# Patient Record
Sex: Male | Born: 1990 | Race: Black or African American | Hispanic: No | Marital: Single | Smoking: Never smoker
Health system: Southern US, Community
[De-identification: ages and names within clinical notes are randomized; demographics above are authoritative.]

## PROBLEM LIST (undated history)

## (undated) DIAGNOSIS — L309 Dermatitis, unspecified: Secondary | ICD-10-CM

## (undated) DIAGNOSIS — J45909 Unspecified asthma, uncomplicated: Secondary | ICD-10-CM

---
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
## 2012-05-12 ENCOUNTER — Encounter (HOSPITAL_COMMUNITY): Payer: Self-pay | Admitting: Emergency Medicine

## 2012-05-12 ENCOUNTER — Emergency Department (HOSPITAL_COMMUNITY)
Admission: EM | Admit: 2012-05-12 | Discharge: 2012-05-12 | Disposition: A | Discharge status: Home / Self Care | Payer: Self-pay | Attending: Emergency Medicine | Admitting: Emergency Medicine

## 2012-05-12 DIAGNOSIS — L309 Dermatitis, unspecified: Secondary | ICD-10-CM

## 2012-05-12 DIAGNOSIS — L259 Unspecified contact dermatitis, unspecified cause: Secondary | ICD-10-CM | POA: Insufficient documentation

## 2012-05-12 HISTORY — DX: Dermatitis, unspecified: L30.9

## 2012-05-12 MED ORDER — LORATADINE 10 MG PO TABS: 10.0000 mg | ORAL_TABLET | Freq: Every day | ORAL | Status: AC

## 2012-05-12 MED ORDER — DIPHENHYDRAMINE HCL 25 MG PO CAPS: 25.0000 mg | ORAL_CAPSULE | Freq: Four times a day (QID) | ORAL | Status: AC | PRN

## 2012-05-12 MED ORDER — DIPHENHYDRAMINE HCL 50 MG/ML IJ SOLN
50.0000 mg | Freq: Once | INTRAMUSCULAR | Status: AC
  Administered 2012-05-12: 50 mg via INTRAMUSCULAR
  Filled 2012-05-12: qty 1

## 2012-05-12 MED ORDER — TRIAMCINOLONE ACETONIDE 0.1 % EX CREA: TOPICAL_CREAM | Freq: Two times a day (BID) | CUTANEOUS | Status: AC

## 2012-05-12 NOTE — ED Notes (Addendum)
PT reports hx of eczema; waking up with stiffness and lots of dry skin. He is extremely itchy and in pain in the places he is having the exacerbations.

## 2012-05-12 NOTE — ED Provider Notes (Signed)
History     CSN: [id number]  Arrival date & time 05/12/12  2147   First MD Initiated Contact with Patient 05/12/12 2238      Chief Complaint  Patient presents with  . Rash   HPI  History provided by patient and family. Patient is a 21 year old African American male with history of eczema and seasonal allergies who presents with complaints of worsening eczema rash over the skin. Patient states he has had some increasing eczema rash over the past few days but woke up this morning with even more severe rash and itching. He reports dry skin covering most of the body. He reports this makes his joints feel stiff. Rash is associated with severe pruritus. Patient does admit to scratching area. He has not taken any medications or used any treatments for his rash. Patient does report using hydrocortisone creams in the past with some improvement. Patient does not have a primary care provider or dermatology specialist for his symptoms. Symptoms have been associated with slight increased rhinorrhea as well. Patient used to take Claritin for seasonal allergy symptoms but is not on any antihistamines currently. He denies any fever, chills or sweats. There is no bleeding or drainage from any scratching or lesions.   Past Medical History  Diagnosis Date  . Eczema     No past surgical history on file.  No family history on file.  History  Substance Use Topics  . Smoking status: Not on file  . Smokeless tobacco: Not on file  . Alcohol Use:       Review of Systems  Constitutional: Negative for fever and chills.  Skin: Positive for rash.  Neurological: Negative for headaches.    Allergies  Peanut-containing drug products  Home Medications  No current outpatient prescriptions on file.  BP 141/82  Pulse 77  Temp 97.8 F (36.6 C) (Oral)  Resp 22  SpO2 100%  Physical Exam  Nursing note and vitals reviewed. Constitutional: He is oriented to person, place, and time. He appears  well-developed and well-nourished. No distress.  HENT:  Head: Normocephalic.  Mouth/Throat: Oropharynx is clear and moist.  Neck: Normal range of motion. Neck supple.       No meningeal signs  Cardiovascular: Normal rate and regular rhythm.   No murmur heard. Pulmonary/Chest: Effort normal and breath sounds normal. No respiratory distress. He has no wheezes. He has no rales.  Abdominal: Soft.  Neurological: He is alert and oriented to person, place, and time.  Skin: Skin is warm. Rash noted. No erythema.       There is diffuse dry rough rash heaviest over extensor surfaces and around the base of the neck posteriorly. Multiple secondary excoriations present without deep ulceration or bleeding. No induration of the skin. Symptoms consistent with eczema  Psychiatric: He has a normal mood and affect. His behavior is normal.    ED Course  Procedures     1. Eczema       MDM  10:50PM patient seen and evaluated. Patient with dry thickened rash covering most of the body heaviest over extensor surfaces. There is dry flaking skin and secondary excoriations. Symptoms consistent with history of eczema        Angus Seller, [place] 05/13/12 [phone]

## 2012-05-13 NOTE — ED Provider Notes (Signed)
Medical screening examination/treatment/procedure(s) were performed by non-physician practitioner and as supervising physician I was immediately available for consultation/collaboration.   Lylliana Kitamura, MD 05/13/12 2215 

## 2017-04-22 ENCOUNTER — Encounter (HOSPITAL_COMMUNITY): Payer: Self-pay | Admitting: Emergency Medicine

## 2017-04-22 ENCOUNTER — Emergency Department (HOSPITAL_COMMUNITY)
Admission: EM | Admit: 2017-04-22 | Discharge: 2017-04-22 | Disposition: A | Discharge status: Home / Self Care | Payer: Self-pay | Attending: Emergency Medicine | Admitting: Emergency Medicine

## 2017-04-22 DIAGNOSIS — W458XXA Other foreign body or object entering through skin, initial encounter: Secondary | ICD-10-CM | POA: Insufficient documentation

## 2017-04-22 DIAGNOSIS — Y939 Activity, unspecified: Secondary | ICD-10-CM | POA: Insufficient documentation

## 2017-04-22 DIAGNOSIS — T162XXA Foreign body in left ear, initial encounter: Secondary | ICD-10-CM | POA: Insufficient documentation

## 2017-04-22 DIAGNOSIS — Y998 Other external cause status: Secondary | ICD-10-CM | POA: Insufficient documentation

## 2017-04-22 DIAGNOSIS — Y929 Unspecified place or not applicable: Secondary | ICD-10-CM | POA: Insufficient documentation

## 2017-04-22 MED ORDER — LIDOCAINE-EPINEPHRINE-TETRACAINE (LET) SOLUTION
3.0000 mL | Freq: Once | NASAL | Status: AC
  Administered 2017-04-22: 3 mL via TOPICAL
  Filled 2017-04-22: qty 3

## 2017-04-22 NOTE — ED Triage Notes (Signed)
Pt noticed earring post sticking out from behind ear lobe but earring on front is gone. Tried to remove himself but it hurt so came here.

## 2017-04-22 NOTE — ED Notes (Addendum)
During downtime. PA Sanders given lidocaine for pt's ear. Given verbal d/c instructions. Pt ambulatory at discharge.

## 2017-04-22 NOTE — ED Provider Notes (Signed)
[facility]-EMERGENCY DEPT Provider Note   CSN: [id number] Arrival date & time: 04/22/17  1216  By signing my name below, I, Rosana Fretana Waskiewicz, attest that this documentation has been prepared under the direction and in the presence of non-physician practitioner, Garlon HatchetSanders, Lynzy Rawles M., PA-C. Electronically Signed: Rosana Fretana Waskiewicz, ED Scribe. 04/22/17. 2:01 PM.  History   Chief Complaint Chief Complaint  Patient presents with  . Foreign Body in Ear   The history is provided by the patient. No language interpreter was used.   HPI Comments: Phillip BolognaDale Chevere is a 26 y.o. male who presents to the Emergency Department to be evaluated for a foreign body attached to his left ear lobe 2 days ago. Pt states he got his ear pierced 1 month ago and noticed the earring to be stuck in the skin. Pt has tried to remove the piece himself with no success. No other complaints at this time.  Past Medical History:  Diagnosis Date  . Eczema     There are no active problems to display for this patient.   History reviewed. No pertinent surgical history.     Home Medications    Prior to Admission medications   Medication Sig Start Date End Date Taking? Authorizing Provider  diphenhydrAMINE (BENADRYL) 25 mg capsule Take 1 capsule (25 mg total) by mouth every 6 (six) hours as needed for itching. 05/12/12 05/22/12  Ivonne Andrewammen, Peter, PA-C  loratadine (CLARITIN) 10 MG tablet Take 1 tablet (10 mg total) by mouth daily. 05/12/12 05/12/13  Ivonne Andrewammen, Peter, PA-C    Family History History reviewed. No pertinent family history.  Social History Social History  Substance Use Topics  . Smoking status: Not on file  . Smokeless tobacco: Not on file  . Alcohol use Not on file     Allergies   Peanut-containing drug products   Review of Systems Review of Systems  Constitutional: Negative for fever.  HENT:       +foreigh body in left ear lobe  Skin: Negative for pallor.     Physical Exam Updated Vital Signs BP (!) 136/99  (BP Location: Left Arm)   Pulse 70   Temp 98.9 F (37.2 C) (Oral)   Resp 16   SpO2 100%   Physical Exam  Constitutional: He is oriented to person, place, and time. He appears well-developed and well-nourished.  HENT:  Head: Normocephalic and atraumatic.  Mouth/Throat: Oropharynx is clear and moist.  Stud type earring embedded into piercing site; back is protruding out, ball of the stud is palpable beneath skin  Eyes: Pupils are equal, round, and reactive to light. Conjunctivae and EOM are normal.  Neck: Normal range of motion.  Cardiovascular: Normal rate, regular rhythm and normal heart sounds.   Pulmonary/Chest: Effort normal and breath sounds normal.  Abdominal: Soft. Bowel sounds are normal.  Musculoskeletal: Normal range of motion.  Neurological: He is alert and oriented to person, place, and time.  Skin: Skin is warm and dry.  Psychiatric: He has a normal mood and affect.  Nursing note and vitals reviewed.    ED Treatments / Results  DIAGNOSTIC STUDIES: Oxygen Saturation is 100% on RA, normal by my interpretation.   COORDINATION OF CARE: 2:21 PM-Discussed next steps with pt including removal of foreign body. Pt verbalized understanding and is agreeable with the plan.   Labs (all labs ordered are listed, but only abnormal results are displayed) Labs Reviewed - No data to display  EKG  EKG Interpretation None       Radiology  No results found.  Procedures .Foreign Body Removal Date/Time: 04/25/2017 5:14 PM Performed by: Garlon Hatchet Authorized by: Garlon Hatchet  Consent: Verbal consent obtained. Risks and benefits: risks, benefits and alternatives were discussed Consent given by: patient Patient understanding: patient states understanding of the procedure being performed Patient identity confirmed: verbally with patient Body area: ear Location details: left ear Anesthesia: local infiltration  Anesthesia: Local Anesthetic: lidocaine 1% without  epinephrine Anesthetic total: 1 mL  Sedation: Patient sedated: no Patient restrained: no Localization method: visualized 1 objects recovered. Objects recovered: earring Post-procedure assessment: foreign body removed Patient tolerance: Patient tolerated the procedure well with no immediate complications   (including critical care time)  Medications Ordered in ED Medications - No data to display   Initial Impression / Assessment and Plan / ED Course  I have reviewed the triage vital signs and the nursing notes.  Pertinent labs & imaging results that were available during my care of the patient were reviewed by me and considered in my medical decision making (see chart for details).  Ear was numbed with lidocaine w/out epi and earring was successfully removed from posterior piercing site and then replaced correctly without issue.  No bleeding or other complications.  Discussed continued care of area due to new piercing.  Can follow-up as needed.  Patient d/c home in stable condition.  Final Clinical Impressions(s) / ED Diagnoses   Final diagnoses:  Foreign body of left ear, initial encounter    New Prescriptions New Prescriptions   No medications on file   I personally performed the services described in this documentation, which was scribed in my presence. The recorded information has been reviewed and is accurate.    Garlon Hatchet, PA-C 04/25/17 1716    Rolland Porter, MD 05/01/17 [phone]

## 2020-09-19 ENCOUNTER — Other Ambulatory Visit: Payer: Self-pay

## 2020-09-19 DIAGNOSIS — Z20822 Contact with and (suspected) exposure to covid-19: Secondary | ICD-10-CM

## 2020-09-20 LAB — SARS-COV-2, NAA 2 DAY TAT

## 2020-09-20 LAB — NOVEL CORONAVIRUS, NAA: SARS-CoV-2, NAA: DETECTED — AB

## 2021-01-03 ENCOUNTER — Emergency Department (HOSPITAL_COMMUNITY)
Admission: EM | Admit: 2021-01-03 | Discharge: 2021-01-03 | Disposition: A | Discharge status: Home / Self Care | Payer: BC Managed Care – PPO | Attending: Emergency Medicine | Admitting: Emergency Medicine

## 2021-01-03 ENCOUNTER — Other Ambulatory Visit: Payer: Self-pay

## 2021-01-03 DIAGNOSIS — R059 Cough, unspecified: Secondary | ICD-10-CM | POA: Diagnosis present

## 2021-01-03 DIAGNOSIS — Z91010 Allergy to peanuts: Secondary | ICD-10-CM | POA: Insufficient documentation

## 2021-01-03 DIAGNOSIS — J4541 Moderate persistent asthma with (acute) exacerbation: Secondary | ICD-10-CM | POA: Insufficient documentation

## 2021-01-03 MED ORDER — AEROCHAMBER PLUS FLO-VU LARGE MISC: 1.0000 | Freq: Once | Status: DC

## 2021-01-03 MED ORDER — ALBUTEROL SULFATE HFA 108 (90 BASE) MCG/ACT IN AERS
6.0000 | INHALATION_SPRAY | Freq: Once | RESPIRATORY_TRACT | Status: AC
  Administered 2021-01-03: 6 via RESPIRATORY_TRACT
  Filled 2021-01-03: qty 6.7

## 2021-01-03 MED ORDER — PREDNISONE 10 MG PO TABS: 50.0000 mg | ORAL_TABLET | Freq: Every day | ORAL | 0 refills | Status: AC

## 2021-01-03 MED ORDER — ALBUTEROL SULFATE HFA 108 (90 BASE) MCG/ACT IN AERS: 1.0000 | INHALATION_SPRAY | Freq: Four times a day (QID) | RESPIRATORY_TRACT | 2 refills | Status: DC | PRN

## 2021-01-03 NOTE — ED Provider Notes (Signed)
[facility] EMERGENCY DEPARTMENT Provider Note   CSN: [id number] Arrival date & time: 01/03/21  1214     History No chief complaint on file.   Phillip Cohen is a 30 y.o. male.  30 year old male with history of asthma presents with complaint of cough, wheezing, chest tightness. Patient reports cough x 2 weeks, taking Zyrtec daily with improvement however worse since last night. Does not have an inhaler. Denies any fevers, chills, sick contacts. No prior hospitalizations for asthma.         Past Medical History:  Diagnosis Date  . Eczema     There are no problems to display for this patient.   No past surgical history on file.     No family history on file.     Home Medications Prior to Admission medications   Medication Sig Start Date End Date Taking? Authorizing Provider  albuterol (VENTOLIN HFA) 108 (90 Base) MCG/ACT inhaler Inhale 1-2 puffs into the lungs every 6 (six) hours as needed for wheezing or shortness of breath. 01/03/21  Yes Jeannie Fend, PA-C  predniSONE (DELTASONE) 10 MG tablet Take 5 tablets (50 mg total) by mouth daily for 5 days. 01/03/21 01/08/21 Yes Jeannie Fend, PA-C  diphenhydrAMINE (BENADRYL) 25 mg capsule Take 1 capsule (25 mg total) by mouth every 6 (six) hours as needed for itching. 05/12/12 05/22/12  Ivonne Andrew, PA-C  loratadine (CLARITIN) 10 MG tablet Take 1 tablet (10 mg total) by mouth daily. 05/12/12 05/12/13  Ivonne Andrew, PA-C    Allergies    Peanut-containing drug products  Review of Systems   Review of Systems  Constitutional: Negative for fever.  HENT: Negative for congestion.   Respiratory: Positive for cough, chest tightness, shortness of breath and wheezing.   Cardiovascular: Negative for chest pain, palpitations and leg swelling.  Gastrointestinal: Negative for nausea and vomiting.  Musculoskeletal: Negative for arthralgias and myalgias.  Skin: Negative for rash and wound.  Allergic/Immunologic:  Negative for immunocompromised state.  Neurological: Negative for weakness.  Hematological: Negative for adenopathy.  Psychiatric/Behavioral: Negative for confusion.  All other systems reviewed and are negative.   Physical Exam Updated Vital Signs BP (!) 146/90 (BP Location: Right Arm)   Pulse 80   Temp 98.6 F (37 C) (Oral)   Resp 16   SpO2 94%   Physical Exam Vitals and nursing note reviewed.  Constitutional:      General: He is not in acute distress.    Appearance: He is well-developed. He is not diaphoretic.  HENT:     Head: Normocephalic and atraumatic.     Nose: Nose normal.     Mouth/Throat:     Mouth: Mucous membranes are moist.  Eyes:     Conjunctiva/sclera: Conjunctivae normal.  Cardiovascular:     Rate and Rhythm: Normal rate and regular rhythm.     Heart sounds: Normal heart sounds.  Pulmonary:     Effort: Pulmonary effort is normal.     Breath sounds: Wheezing present.  Musculoskeletal:     Right lower leg: No edema.     Left lower leg: No edema.  Skin:    General: Skin is warm and dry.     Findings: No erythema or rash.  Neurological:     Mental Status: He is alert and oriented to person, place, and time.  Psychiatric:        Behavior: Behavior normal.     ED Results / Procedures / Treatments   Labs (  all labs ordered are listed, but only abnormal results are displayed) Labs Reviewed - No data to display  EKG None  Radiology No results found.  Procedures Procedures   Medications Ordered in ED Medications  AeroChamber Plus Flo-Vu Large MISC 1 each (has no administration in time range)  albuterol (VENTOLIN HFA) 108 (90 Base) MCG/ACT inhaler 6 puff (6 puffs Inhalation Given 01/03/21 1235)    ED Course  I have reviewed the triage vital signs and the nursing notes.  Pertinent labs & imaging results that were available during my care of the patient were reviewed by me and considered in my medical decision making (see chart for  details).  Clinical Course as of 01/03/21 1402  Thu Jan 03, 2021  [id number] 30 year old male with asthma as above, on exam found to have diffuse wheezing, not in distress, vitals reassuring. On recheck, wheezing has resolved, patient is feeling improved. Offered for patient to continue to wait or can dc from triage with prednisone and inhaler from triage. Plan is to return for worsening or concerning symptoms, follow up with PCP, given referral. Patient feels better and would like to dc at this time.  [LM]    Clinical Course User Index [LM] Alden Hipp   MDM Rules/Calculators/A&P                          Final Clinical Impression(s) / ED Diagnoses Final diagnoses:  Moderate persistent asthma with exacerbation    Rx / DC Orders ED Discharge Orders         Ordered    predniSONE (DELTASONE) 10 MG tablet  Daily        01/03/21 1342    albuterol (VENTOLIN HFA) 108 (90 Base) MCG/ACT inhaler  Every 6 hours PRN        01/03/21 1342           Jeannie Fend, PA-C 01/03/21 1402    Margarita Grizzle, MD 01/07/21 1407

## 2021-01-03 NOTE — ED Triage Notes (Signed)
Emergency Medicine Provider Triage Evaluation Note  Phillip Cohen , a 30 y.o. male  was evaluated in triage.  Pt complains of for several weeks since yesterday feeling tight, wheezing, shortness of breath.  Does not use inhaler.  Review of Systems  Positive: Shortness of breath, wheezing, cough Negative: Fever sick contacts  Physical Exam  There were no vitals taken for this visit. Gen:   Awake, no distress   HEENT:  Atraumatic  Resp:  Normal effort, wheezing throughout Cardiac:  Normal rate  Abd:   Nondistended, nontender  MSK:   Moves extremities without difficulty  Neuro:  Speech clear   Medical Decision Making  Medically screening exam initiated at 12:28 PM.  Appropriate orders placed.  Phillip Cohen was informed that the remainder of the evaluation will be completed by another provider, this initial triage assessment does not replace that evaluation, and the importance of remaining in the ED until their evaluation is complete.  Clinical Impression  Albuterol inhaler and spacer ordered    Jeannie Fend, PA-C 01/03/21 1229

## 2021-01-03 NOTE — ED Triage Notes (Signed)
Pt presents to the ED with chest tightness and wheezing for 2 weeks but it has gotten worse in the last day. Hx of asthma.

## 2021-01-03 NOTE — Discharge Instructions (Signed)
Take your Zyrtec daily. Use inhaler as needed as prescribed for cough or shortness of breath or wheezing. Add Flonase as needed. Return to ER for severe concerning symptoms otherwise follow-up with primary care provider.  Referral given, primary care if needed.  Take prednisone as prescribed and complete the full course.

## 2021-03-26 ENCOUNTER — Other Ambulatory Visit: Payer: Self-pay

## 2021-03-26 ENCOUNTER — Ambulatory Visit
Admission: EM | Admit: 2021-03-26 | Discharge: 2021-03-26 | Disposition: A | Discharge status: Home / Self Care | Payer: Self-pay | Attending: Student | Admitting: Student

## 2021-03-26 DIAGNOSIS — J4521 Mild intermittent asthma with (acute) exacerbation: Secondary | ICD-10-CM

## 2021-03-26 DIAGNOSIS — Z760 Encounter for issue of repeat prescription: Secondary | ICD-10-CM

## 2021-03-26 DIAGNOSIS — J301 Allergic rhinitis due to pollen: Secondary | ICD-10-CM

## 2021-03-26 HISTORY — DX: Unspecified asthma, uncomplicated: J45.909

## 2021-03-26 MED ORDER — PREDNISONE 20 MG PO TABS: 40.0000 mg | ORAL_TABLET | Freq: Every day | ORAL | 0 refills | Status: AC

## 2021-03-26 MED ORDER — ALBUTEROL SULFATE HFA 108 (90 BASE) MCG/ACT IN AERS: 1.0000 | INHALATION_SPRAY | Freq: Four times a day (QID) | RESPIRATORY_TRACT | 2 refills | Status: AC | PRN

## 2021-03-26 NOTE — ED Provider Notes (Signed)
[facility]    CSN: [id number] Arrival date & time: 03/26/21  1305      History   Chief Complaint Chief Complaint  Patient presents with   Asthma    HPI Phillip Cohen is a 30 y.o. male presenting with asthma exacerbation x1 day following running out of albuterol inhaler.  Medical history asthma, eczema, allergies.  States he has been out of his inhaler for few months, which has been okay until the current exacerbation.  He does also note allergic rhinitis that is currently untreated but he occasionally will take Zyrtec.  Endorses wheezing, shortness of breath, nasal congestion.  Denies recent URI, fever/chills, dizziness, chest pain, weakness.  HPI  Past Medical History:  Diagnosis Date   Asthma    Eczema     There are no problems to display for this patient.   History reviewed. No pertinent surgical history.     Home Medications    Prior to Admission medications   Medication Sig Start Date End Date Taking? Authorizing Provider  predniSONE (DELTASONE) 20 MG tablet Take 2 tablets (40 mg total) by mouth daily for 5 days. 03/26/21 03/31/21 Yes Rhys Martini, PA-C  albuterol (VENTOLIN HFA) 108 (90 Base) MCG/ACT inhaler Inhale 1-2 puffs into the lungs every 6 (six) hours as needed for wheezing or shortness of breath. 03/26/21   Rhys Martini, PA-C  diphenhydrAMINE (BENADRYL) 25 mg capsule Take 1 capsule (25 mg total) by mouth every 6 (six) hours as needed for itching. 05/12/12 05/22/12  Ivonne Andrew, PA-C  loratadine (CLARITIN) 10 MG tablet Take 1 tablet (10 mg total) by mouth daily. 05/12/12 05/12/13  Ivonne Andrew, PA-C    Family History History reviewed. No pertinent family history.  Social History Social History   Tobacco Use   Smoking status: Never   Smokeless tobacco: Never  Substance Use Topics   Alcohol use: Not Currently   Drug use: Not Currently     Allergies   Peanut-containing drug products   Review of Systems Review of Systems   Constitutional:  Negative for appetite change, chills and fever.  HENT:  Positive for congestion. Negative for ear pain, rhinorrhea, sinus pressure, sinus pain and sore throat.   Eyes:  Negative for redness and visual disturbance.  Respiratory:  Positive for shortness of breath and wheezing. Negative for cough and chest tightness.   Cardiovascular:  Negative for chest pain and palpitations.  Gastrointestinal:  Negative for abdominal pain, constipation, diarrhea, nausea and vomiting.  Genitourinary:  Negative for dysuria, frequency and urgency.  Musculoskeletal:  Negative for myalgias.  Neurological:  Negative for dizziness, weakness and headaches.  Psychiatric/Behavioral:  Negative for confusion.   All other systems reviewed and are negative.   Physical Exam Triage Vital Signs ED Triage Vitals  Enc Vitals Group     BP 03/26/21 1453 122/85     Pulse Rate 03/26/21 1453 69     Resp 03/26/21 1453 18     Temp 03/26/21 1453 98.5 F (36.9 C)     Temp Source 03/26/21 1453 Oral     SpO2 03/26/21 1453 98 %     Weight 03/26/21 1454 230 lb (104.3 kg)     Height 03/26/21 1454 5\' 5"  (1.651 m)     Head Circumference --      Peak Flow --      Pain Score 03/26/21 1453 0     Pain Loc --      Pain Edu? --  Excl. in GC? --    No data found.  Updated Vital Signs BP 122/85 (BP Location: Left Arm)   Pulse 69   Temp 98.5 F (36.9 C) (Oral)   Resp 18   Ht 5\' 5"  (1.651 m)   Wt 230 lb (104.3 kg)   SpO2 98%   BMI 38.27 kg/m   Visual Acuity Right Eye Distance:   Left Eye Distance:   Bilateral Distance:    Right Eye Near:   Left Eye Near:    Bilateral Near:     Physical Exam Vitals reviewed.  Constitutional:      General: He is not in acute distress.    Appearance: Normal appearance. He is not ill-appearing.     Interventions: He is not intubated. HENT:     Head: Normocephalic and atraumatic.     Right Ear: Hearing, tympanic membrane, ear canal and external ear normal. No  swelling or tenderness. There is no impacted cerumen. No mastoid tenderness. Tympanic membrane is not perforated, erythematous, retracted or bulging.     Left Ear: Hearing, tympanic membrane, ear canal and external ear normal. No swelling or tenderness. There is no impacted cerumen. No mastoid tenderness. Tympanic membrane is not perforated, erythematous, retracted or bulging.     Nose:     Right Sinus: No maxillary sinus tenderness or frontal sinus tenderness.     Left Sinus: No maxillary sinus tenderness or frontal sinus tenderness.     Mouth/Throat:     Mouth: Mucous membranes are moist.     Pharynx: Uvula midline. No oropharyngeal exudate or posterior oropharyngeal erythema.     Tonsils: No tonsillar exudate.  Cardiovascular:     Rate and Rhythm: Normal rate and regular rhythm.     Heart sounds: Normal heart sounds.  Pulmonary:     Effort: Pulmonary effort is normal. No tachypnea, bradypnea, accessory muscle usage, prolonged expiration, respiratory distress or retractions. He is not intubated.     Breath sounds: Normal air entry. Wheezing present. No decreased breath sounds, rhonchi or rales.     Comments: Inspiratory and expiratory wheezes throughout. Chest:     Chest wall: No tenderness.  Abdominal:     General: Abdomen is flat. Bowel sounds are normal.     Tenderness: There is no abdominal tenderness. There is no guarding or rebound.  Lymphadenopathy:     Cervical: No cervical adenopathy.  Neurological:     General: No focal deficit present.     Mental Status: He is alert and oriented to person, place, and time.  Psychiatric:        Attention and Perception: Attention and perception normal.        Mood and Affect: Mood and affect normal.        Behavior: Behavior normal. Behavior is cooperative.        Thought Content: Thought content normal.        Judgment: Judgment normal.     UC Treatments / Results  Labs (all labs ordered are listed, but only abnormal results are  displayed) Labs Reviewed - No data to display  EKG   Radiology No results found.  Procedures Procedures (including critical care time)  Medications Ordered in UC Medications - No data to display  Initial Impression / Assessment and Plan / UC Course  I have reviewed the triage vital signs and the nursing notes.  Pertinent labs & imaging results that were available during my care of the patient were reviewed by me and considered  in my medical decision making (see chart for details).     This patient is a very pleasant 30 y.o. year old male presenting with asthma exacerbation. Afebrile, nontachy. Wheezes throughout.   Albuterol refilled as below. Prednisone, he is not a diabetic.  Zyrtec for allergic rhinitis component.   ED return precautions discussed. Patient verbalizes understanding and agreement.   Coding this visit a level 4 for acute illness with acute exacerbation and prescription drug management.  Final Clinical Impressions(s) / UC Diagnoses   Final diagnoses:  Mild intermittent asthma with acute exacerbation  Medication refill  Seasonal allergic rhinitis due to pollen     Discharge Instructions      -Albuterol inhaler up to every 6 hours during asthma flare -Zyrtec daily x7 days, continue for longer if it's helping -Prednisone, 2 pills taken at the same time for 5 days in a row.  Try taking this earlier in the day as it can give you energy. Avoid NSAIDs like ibuprofen and alleve while taking this medication as they can increase your risk of stomach upset and even GI bleeding when in combination with a steroid. You can continue tylenol (acetaminophen) up to 1000mg  3x daily.      ED Prescriptions     Medication Sig Dispense Auth. Provider   albuterol (VENTOLIN HFA) 108 (90 Base) MCG/ACT inhaler Inhale 1-2 puffs into the lungs every 6 (six) hours as needed for wheezing or shortness of breath. 18 g , PA-C   predniSONE (DELTASONE) 20 MG tablet  Take 2 tablets (40 mg total) by mouth daily for 5 days. 10 tablet Rhys Martini, PA-C      PDMP not reviewed this encounter.   Rhys Martini, PA-C 03/26/21 1536

## 2021-03-26 NOTE — Discharge Instructions (Addendum)
-  Albuterol inhaler up to every 6 hours during asthma flare -Zyrtec daily x7 days, continue for longer if it's helping -Prednisone, 2 pills taken at the same time for 5 days in a row.  Try taking this earlier in the day as it can give you energy. Avoid NSAIDs like ibuprofen and alleve while taking this medication as they can increase your risk of stomach upset and even GI bleeding when in combination with a steroid. You can continue tylenol (acetaminophen) up to 1000mg  3x daily.

## 2021-03-26 NOTE — ED Triage Notes (Signed)
Pt c/o asthma exacerbation since yesterday with wheezing and chest tightness. No distress noted. States he is out of his inhaler.

## 2021-05-19 ENCOUNTER — Other Ambulatory Visit: Payer: Self-pay

## 2021-05-19 ENCOUNTER — Encounter (HOSPITAL_COMMUNITY): Payer: Self-pay | Admitting: Emergency Medicine

## 2021-05-19 ENCOUNTER — Emergency Department (HOSPITAL_COMMUNITY)
Admission: EM | Admit: 2021-05-19 | Discharge: 2021-05-19 | Disposition: A | Discharge status: Home / Self Care | Payer: BC Managed Care – PPO | Attending: Emergency Medicine | Admitting: Emergency Medicine

## 2021-05-19 DIAGNOSIS — W260XXA Contact with knife, initial encounter: Secondary | ICD-10-CM | POA: Insufficient documentation

## 2021-05-19 DIAGNOSIS — S61210A Laceration without foreign body of right index finger without damage to nail, initial encounter: Secondary | ICD-10-CM | POA: Insufficient documentation

## 2021-05-19 DIAGNOSIS — Z5321 Procedure and treatment not carried out due to patient leaving prior to being seen by health care provider: Secondary | ICD-10-CM | POA: Insufficient documentation

## 2021-05-19 NOTE — ED Notes (Signed)
Pt stated he was leaving. 

## 2021-05-19 NOTE — ED Triage Notes (Signed)
Pt reports he cut his right pointer finger on a knife.

## 2021-07-01 ENCOUNTER — Other Ambulatory Visit: Payer: Self-pay

## 2021-07-01 ENCOUNTER — Emergency Department (HOSPITAL_COMMUNITY)
Admission: EM | Admit: 2021-07-01 | Discharge: 2021-07-01 | Disposition: A | Discharge status: Home / Self Care | Payer: BC Managed Care – PPO | Attending: Emergency Medicine | Admitting: Emergency Medicine

## 2021-07-01 ENCOUNTER — Emergency Department (HOSPITAL_COMMUNITY): Payer: BC Managed Care – PPO

## 2021-07-01 DIAGNOSIS — J45901 Unspecified asthma with (acute) exacerbation: Secondary | ICD-10-CM | POA: Insufficient documentation

## 2021-07-01 DIAGNOSIS — Z8616 Personal history of COVID-19: Secondary | ICD-10-CM | POA: Diagnosis not present

## 2021-07-01 DIAGNOSIS — Z5321 Procedure and treatment not carried out due to patient leaving prior to being seen by health care provider: Secondary | ICD-10-CM | POA: Insufficient documentation

## 2021-07-01 DIAGNOSIS — R0602 Shortness of breath: Secondary | ICD-10-CM | POA: Diagnosis present

## 2021-07-01 LAB — CBC WITH DIFFERENTIAL/PLATELET
Abs Immature Granulocytes: 0.08 10*3/uL — ABNORMAL HIGH (ref 0.00–0.07)
Basophils Absolute: 0.1 10*3/uL (ref 0.0–0.1)
Basophils Relative: 1 %
Eosinophils Absolute: 0.6 10*3/uL — ABNORMAL HIGH (ref 0.0–0.5)
Eosinophils Relative: 5 %
HCT: 45.6 % (ref 39.0–52.0)
Hemoglobin: 15 g/dL (ref 13.0–17.0)
Immature Granulocytes: 1 %
Lymphocytes Relative: 17 %
Lymphs Abs: 1.9 10*3/uL (ref 0.7–4.0)
MCH: 26.8 pg (ref 26.0–34.0)
MCHC: 32.9 g/dL (ref 30.0–36.0)
MCV: 81.4 fL (ref 80.0–100.0)
Monocytes Absolute: 0.8 10*3/uL (ref 0.1–1.0)
Monocytes Relative: 7 %
Neutro Abs: 7.7 10*3/uL (ref 1.7–7.7)
Neutrophils Relative %: 69 %
Platelets: 256 10*3/uL (ref 150–400)
RBC: 5.6 MIL/uL (ref 4.22–5.81)
RDW: 13.5 % (ref 11.5–15.5)
WBC: 11.1 10*3/uL — ABNORMAL HIGH (ref 4.0–10.5)
nRBC: 0 % (ref 0.0–0.2)

## 2021-07-01 LAB — BASIC METABOLIC PANEL
Anion gap: 8 (ref 5–15)
BUN: 15 mg/dL (ref 6–20)
CO2: 26 mmol/L (ref 22–32)
Calcium: 9.3 mg/dL (ref 8.9–10.3)
Chloride: 103 mmol/L (ref 98–111)
Creatinine, Ser: 1.13 mg/dL (ref 0.61–1.24)
GFR, Estimated: >60 mL/min (ref >60)
Glucose, Bld: 98 mg/dL (ref 70–99)
Potassium: 4.3 mmol/L (ref 3.5–5.1)
Sodium: 137 mmol/L (ref 135–145)

## 2021-07-01 LAB — TROPONIN I (HIGH SENSITIVITY): Troponin I (High Sensitivity): 6 ng/L (ref <18)

## 2021-07-01 MED ORDER — IPRATROPIUM-ALBUTEROL 0.5-2.5 (3) MG/3ML IN SOLN: 3.0000 mL | Freq: Once | RESPIRATORY_TRACT | Status: DC

## 2021-07-01 NOTE — ED Notes (Signed)
Pt not responding for vital recheck 2x  

## 2021-07-01 NOTE — ED Triage Notes (Signed)
Pt. Stated, My asthama started acting up this morning and Im out of my inhaler.

## 2021-07-01 NOTE — ED Notes (Addendum)
Pt called multiple no answer  

## 2021-07-01 NOTE — ED Provider Notes (Signed)
Emergency Medicine Provider Triage Evaluation Note  Phillip Cohen , a 30 y.o. male  was evaluated in triage.  Pt complains of worsening asthma symptoms, wheezing, shortness of breath since this morning.  Patient reports that his asthma often flares with change in seasons, he normally uses an albuterol inhaler, however he has been out of his albuterol [place] for the last 2 to 3 weeks.  Patient reports he does not have any other medication that he takes regularly for asthma, he does take some Claritin or Zyrtec for allergies.  Patient reports that he has some dull chest pain associated with shortness of breath that has been unchanged that he has been experiencing for around a year since he last had COVID.  Chest pain is not exertional in nature.   Review of Systems  Positive: Shortness of breath, wheezing, chest pain Negative: Diaphoresis, nausea, vomiting  Physical Exam  BP (!) 143/87 (BP Location: Right Arm)   Pulse 74   Temp 97.6 F (36.4 C) (Oral)   Resp 20   SpO2 99%  Gen:   Awake, no distress  Resp:  Normal effort, minimal inspiratory expiratory wheezing bilaterally, no consolidation or decreased breath sounds noted MSK:   Moves extremities without difficulty  Other:    Medical Decision Making  Medically screening exam initiated at 9:59 AM.  Appropriate orders placed.  Phillip Cohen was informed that the remainder of the evaluation will be completed by another provider, this initial triage assessment does not replace that evaluation, and the importance of remaining in the ED until their evaluation is complete.  Asthma exacerbation   Phillip Cohen 07/01/21 1001    Mancel Bale, MD 07/01/21 2051

## 2022-12-07 IMAGING — CR DG CHEST 2V
2 series · 2 of 2 positions shown · non-contrast
Comparison: None.

CLINICAL DATA: Shortness of breath, wheezing

EXAM:
CHEST - 2 VIEW

[chest pa]
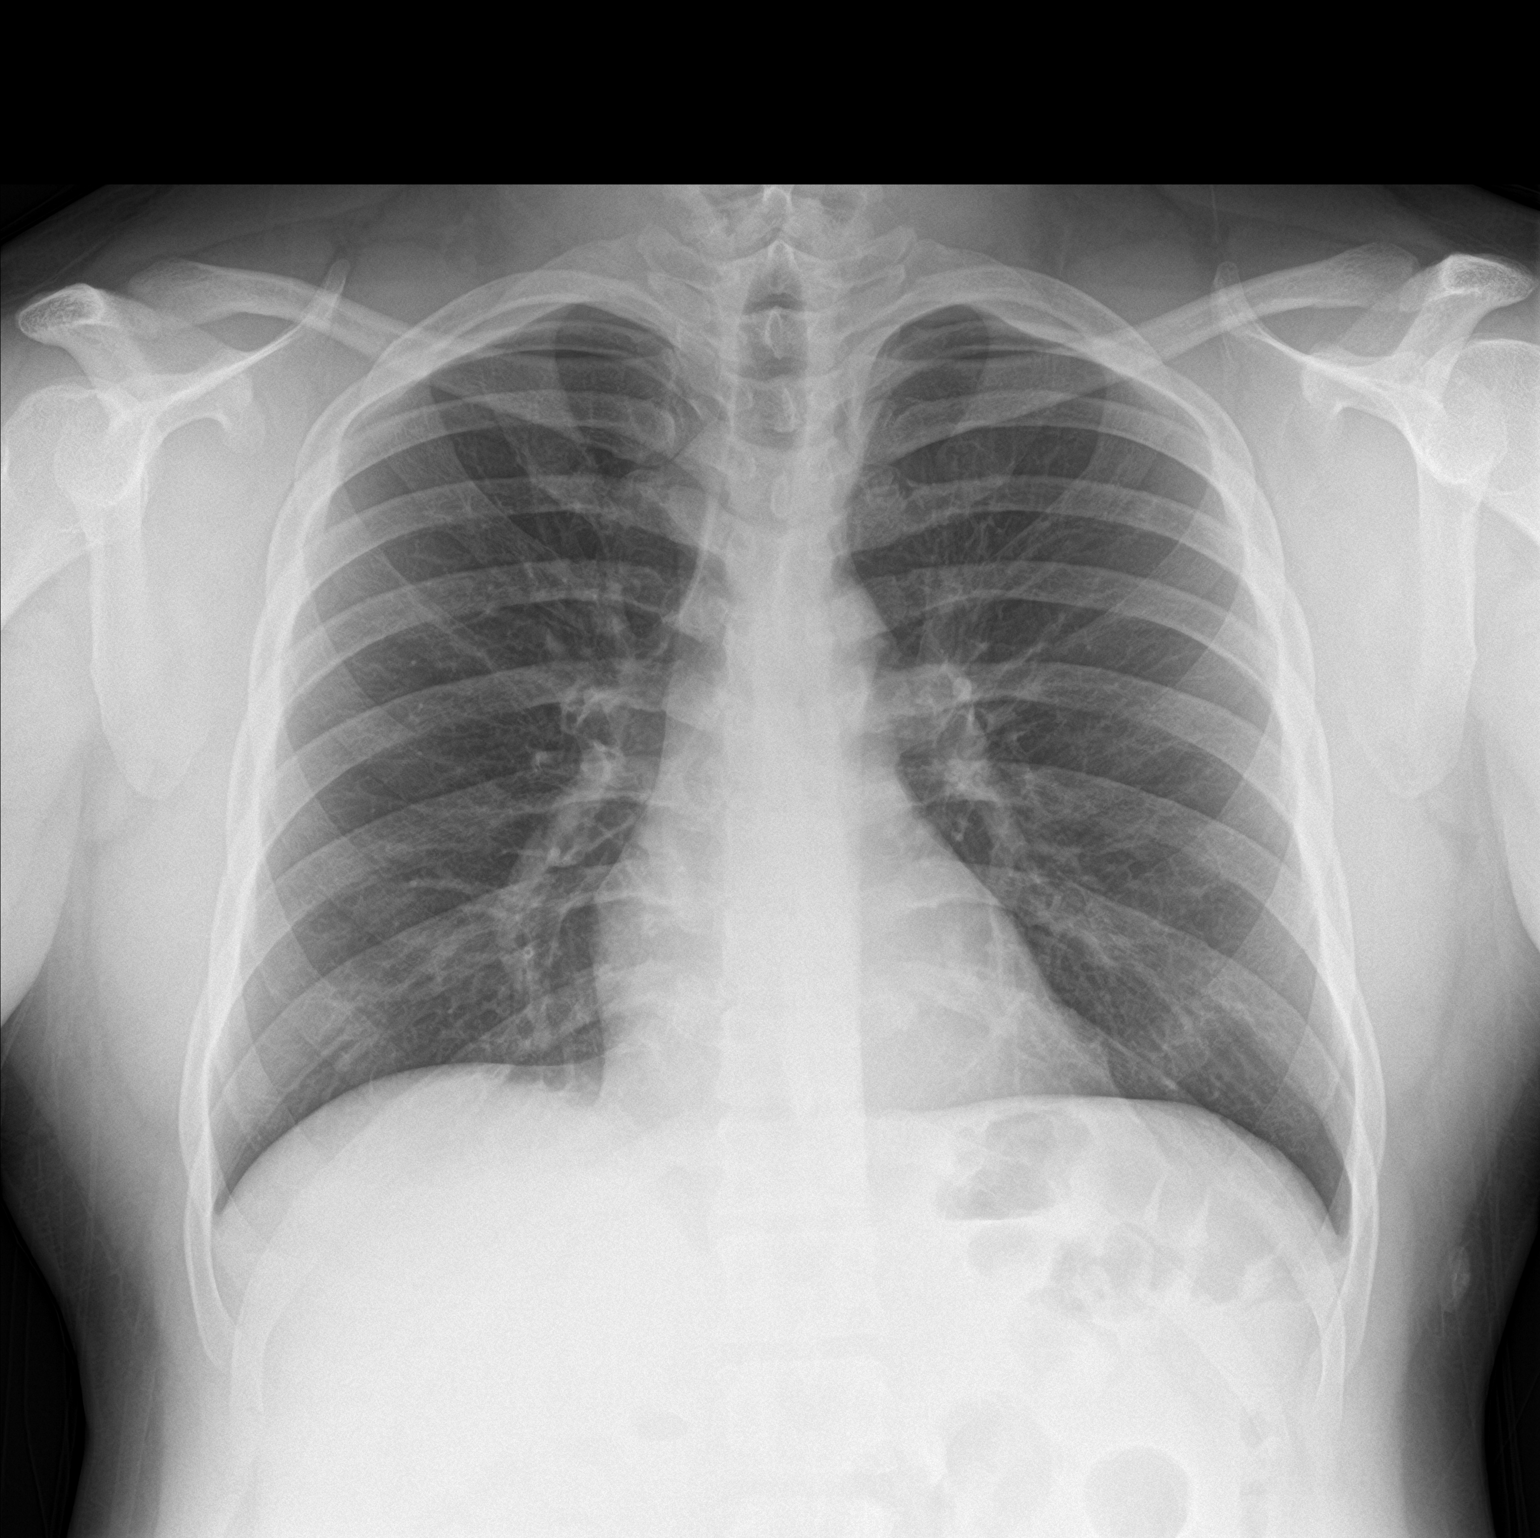

[chest lat]
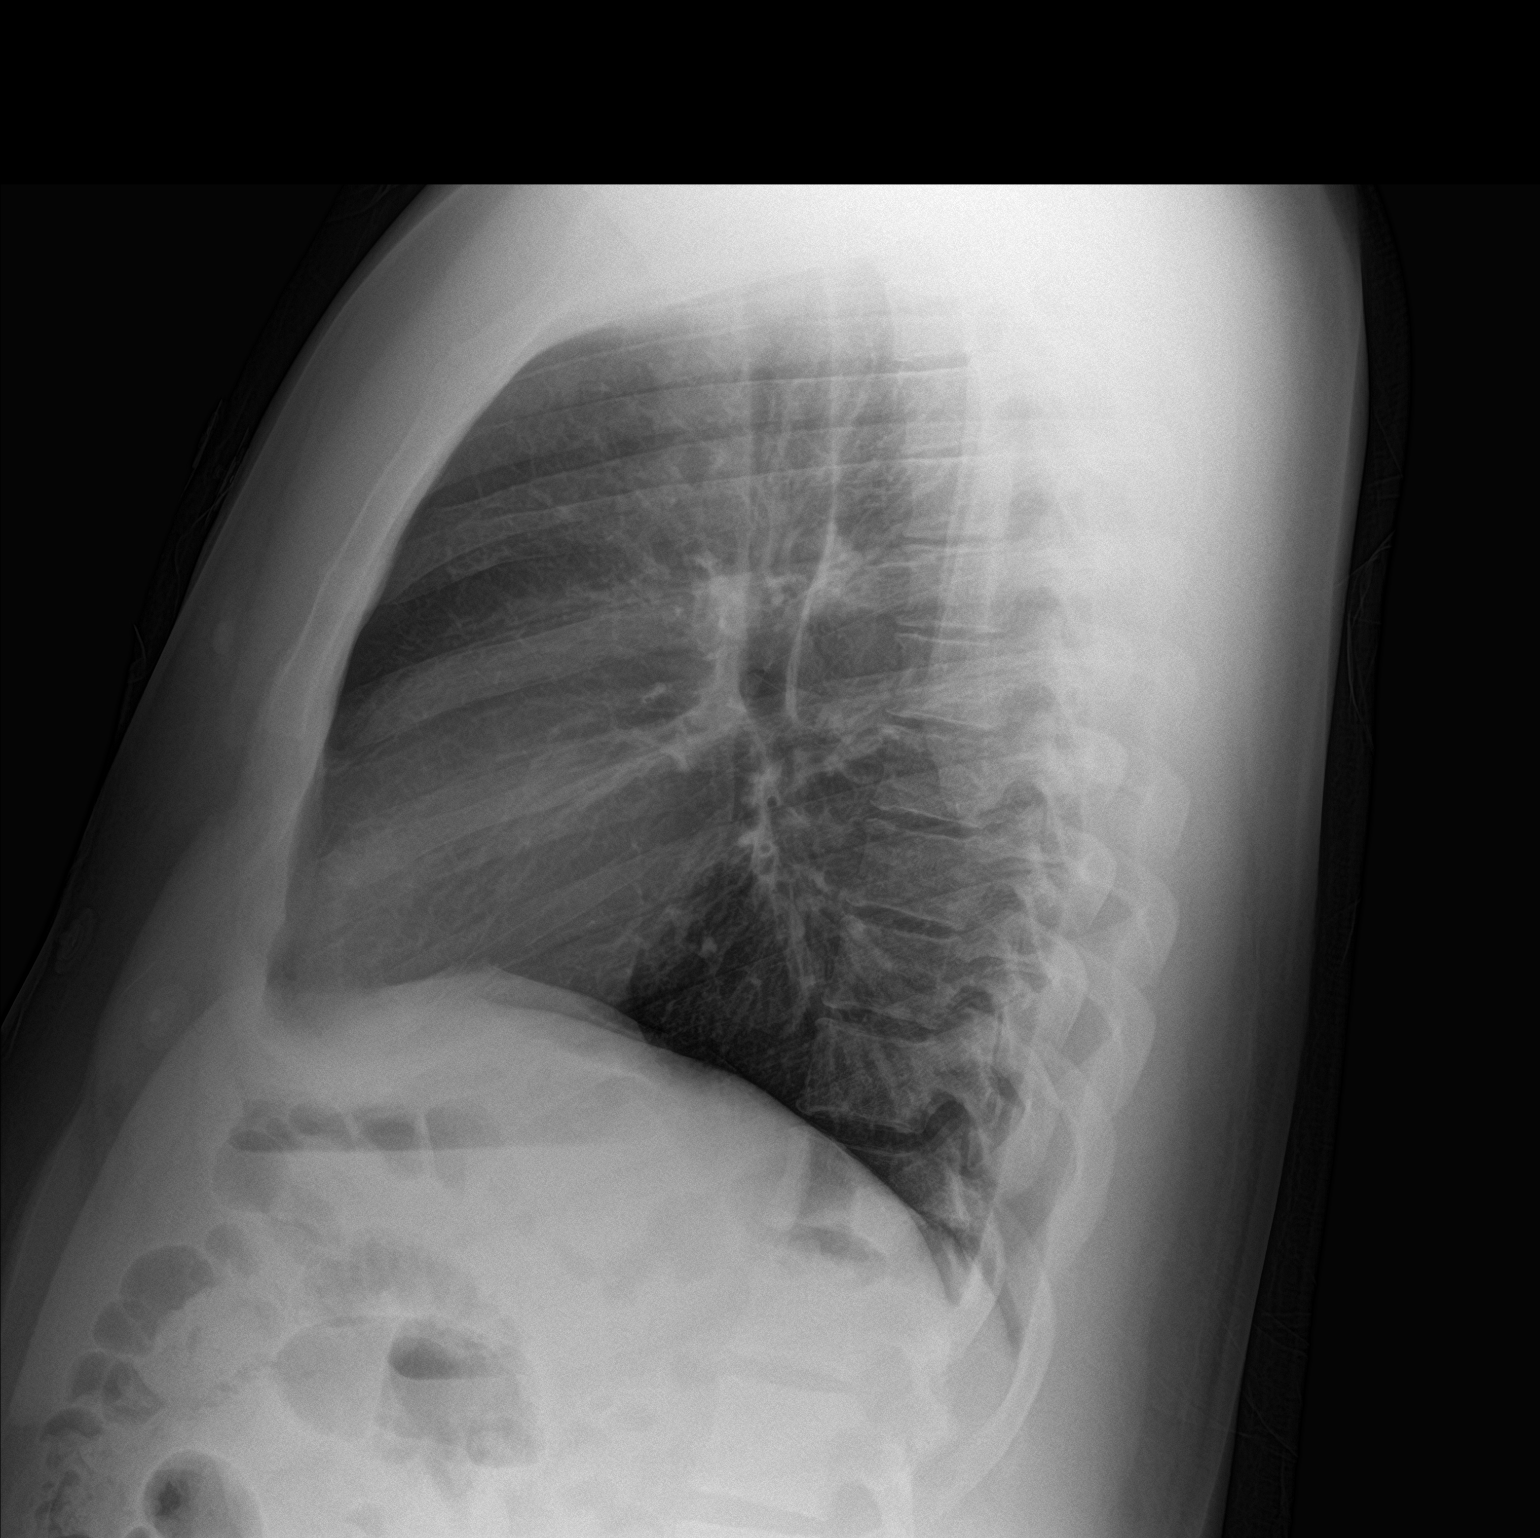

[2 of 2 positions shown; findings below may reference images not displayed]

FINDINGS: The heart size and mediastinal contours are within normal limits.
Both lungs are clear. The visualized skeletal structures are
unremarkable.
IMPRESSION: No active cardiopulmonary disease.
